# Patient Record
Sex: Female | Born: 1980 | Race: Black or African American | Hispanic: No | Marital: Single | Smoking: Never smoker
Health system: Southern US, Community
[De-identification: ages and names within clinical notes are randomized; demographics above are authoritative.]

---

## 2016-11-04 ENCOUNTER — Encounter (HOSPITAL_COMMUNITY): Payer: Self-pay | Admitting: Emergency Medicine

## 2016-11-04 ENCOUNTER — Emergency Department (HOSPITAL_COMMUNITY): Payer: Medicaid Other

## 2016-11-04 ENCOUNTER — Emergency Department (HOSPITAL_COMMUNITY)
Admission: EM | Admit: 2016-11-04 | Discharge: 2016-11-04 | Disposition: A | Discharge status: Other Acute Inpatient Hospital | Payer: Medicaid Other | Attending: Emergency Medicine | Admitting: Emergency Medicine

## 2016-11-04 DIAGNOSIS — Y999 Unspecified external cause status: Secondary | ICD-10-CM | POA: Diagnosis not present

## 2016-11-04 DIAGNOSIS — S32445A Nondisplaced fracture of posterior column [ilioischial] of left acetabulum, initial encounter for closed fracture: Secondary | ICD-10-CM | POA: Diagnosis not present

## 2016-11-04 DIAGNOSIS — S8991XA Unspecified injury of right lower leg, initial encounter: Secondary | ICD-10-CM | POA: Diagnosis present

## 2016-11-04 DIAGNOSIS — S32402A Unspecified fracture of left acetabulum, initial encounter for closed fracture: Secondary | ICD-10-CM

## 2016-11-04 DIAGNOSIS — F10920 Alcohol use, unspecified with intoxication, uncomplicated: Secondary | ICD-10-CM

## 2016-11-04 DIAGNOSIS — F10229 Alcohol dependence with intoxication, unspecified: Secondary | ICD-10-CM | POA: Diagnosis not present

## 2016-11-04 DIAGNOSIS — Y939 Activity, unspecified: Secondary | ICD-10-CM | POA: Insufficient documentation

## 2016-11-04 DIAGNOSIS — S92141A Displaced dome fracture of right talus, initial encounter for closed fracture: Secondary | ICD-10-CM | POA: Diagnosis not present

## 2016-11-04 DIAGNOSIS — S0990XA Unspecified injury of head, initial encounter: Secondary | ICD-10-CM | POA: Insufficient documentation

## 2016-11-04 DIAGNOSIS — M542 Cervicalgia: Secondary | ICD-10-CM | POA: Diagnosis not present

## 2016-11-04 DIAGNOSIS — S82831A Other fracture of upper and lower end of right fibula, initial encounter for closed fracture: Secondary | ICD-10-CM

## 2016-11-04 DIAGNOSIS — S0081XA Abrasion of other part of head, initial encounter: Secondary | ICD-10-CM | POA: Diagnosis not present

## 2016-11-04 DIAGNOSIS — S82421A Displaced transverse fracture of shaft of right fibula, initial encounter for closed fracture: Secondary | ICD-10-CM | POA: Diagnosis not present

## 2016-11-04 DIAGNOSIS — Y92410 Unspecified street and highway as the place of occurrence of the external cause: Secondary | ICD-10-CM | POA: Insufficient documentation

## 2016-11-04 DIAGNOSIS — S92144A Nondisplaced dome fracture of right talus, initial encounter for closed fracture: Secondary | ICD-10-CM

## 2016-11-04 LAB — CBC WITH DIFFERENTIAL/PLATELET
Basophils Absolute: 0 10*3/uL (ref 0.0–0.1)
Basophils Relative: 0 %
EOS ABS: 0 10*3/uL (ref 0.0–0.7)
EOS PCT: 1 %
HCT: 42.6 % (ref 36.0–46.0)
HEMOGLOBIN: 13.8 g/dL (ref 12.0–15.0)
LYMPHS ABS: 1.7 10*3/uL (ref 0.7–4.0)
Lymphocytes Relative: 35 %
MCH: 30.8 pg (ref 26.0–34.0)
MCHC: 32.4 g/dL (ref 30.0–36.0)
MCV: 95.1 fL (ref 78.0–100.0)
MONOS PCT: 9 %
Monocytes Absolute: 0.4 10*3/uL (ref 0.1–1.0)
Neutro Abs: 2.6 10*3/uL (ref 1.7–7.7)
Neutrophils Relative %: 55 %
Platelets: 162 10*3/uL (ref 150–400)
RBC: 4.48 MIL/uL (ref 3.87–5.11)
RDW: 13 % (ref 11.5–15.5)
WBC: 4.8 10*3/uL (ref 4.0–10.5)

## 2016-11-04 LAB — COMPREHENSIVE METABOLIC PANEL
ALT: 19 U/L (ref 14–54)
AST: 25 U/L (ref 15–41)
Albumin: 3.9 g/dL (ref 3.5–5.0)
Alkaline Phosphatase: 32 U/L — ABNORMAL LOW (ref 38–126)
Anion gap: 11 (ref 5–15)
BILIRUBIN TOTAL: 0.5 mg/dL (ref 0.3–1.2)
BUN: 10 mg/dL (ref 6–20)
CALCIUM: 9 mg/dL (ref 8.9–10.3)
CO2: 22 mmol/L (ref 22–32)
CREATININE: 0.78 mg/dL (ref 0.44–1.00)
Chloride: 109 mmol/L (ref 101–111)
GFR calc Af Amer: >60 mL/min (ref >60)
Glucose, Bld: 86 mg/dL (ref 65–99)
Potassium: 3.5 mmol/L (ref 3.5–5.1)
Sodium: 142 mmol/L (ref 135–145)
TOTAL PROTEIN: 6.8 g/dL (ref 6.5–8.1)

## 2016-11-04 LAB — ETHANOL: ALCOHOL ETHYL (B): 173 mg/dL — AB (ref <5)

## 2016-11-04 LAB — I-STAT CG4 LACTIC ACID, ED
LACTIC ACID, VENOUS: 2.87 mmol/L — AB (ref 0.5–1.9)
Lactic Acid, Venous: 4.16 mmol/L (ref 0.5–1.9)

## 2016-11-04 LAB — SAMPLE TO BLOOD BANK

## 2016-11-04 LAB — I-STAT BETA HCG BLOOD, ED (MC, WL, AP ONLY): I-stat hCG, quantitative: <5 m[IU]/mL (ref <5)

## 2016-11-04 MED ORDER — SODIUM CHLORIDE 0.9 % IV BOLUS (SEPSIS)
1000.0000 mL | Freq: Once | INTRAVENOUS | Status: AC
  Administered 2016-11-04: 1000 mL via INTRAVENOUS

## 2016-11-04 MED ORDER — IOPAMIDOL (ISOVUE-300) INJECTION 61%
INTRAVENOUS | Status: AC
  Administered 2016-11-04: 100 mL
  Filled 2016-11-04: qty 100

## 2016-11-04 MED ORDER — ONDANSETRON HCL 4 MG/2ML IJ SOLN
4.0000 mg | Freq: Once | INTRAMUSCULAR | Status: AC
  Administered 2016-11-04: 4 mg via INTRAVENOUS
  Filled 2016-11-04: qty 2

## 2016-11-04 MED ORDER — HYDROMORPHONE HCL 2 MG/ML IJ SOLN
1.0000 mg | Freq: Once | INTRAMUSCULAR | Status: AC
  Administered 2016-11-04: 1 mg via INTRAVENOUS
  Filled 2016-11-04: qty 1

## 2016-11-04 MED ORDER — FENTANYL CITRATE (PF) 100 MCG/2ML IJ SOLN
50.0000 ug | Freq: Once | INTRAMUSCULAR | Status: AC
  Administered 2016-11-04: 50 ug via INTRAVENOUS
  Filled 2016-11-04: qty 2

## 2016-11-04 MED ORDER — OXYCODONE-ACETAMINOPHEN 5-325 MG PO TABS
1.0000 | ORAL_TABLET | Freq: Once | ORAL | Status: AC
  Administered 2016-11-04: 1 via ORAL
  Filled 2016-11-04: qty 1

## 2016-11-04 NOTE — ED Notes (Signed)
[facility] called for Pt transfer to [facility]

## 2016-11-04 NOTE — ED Notes (Signed)
Pt transported to CT ?
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
## 2016-11-04 NOTE — ED Notes (Signed)
Vital signs stable. 

## 2016-11-04 NOTE — ED Notes (Signed)
Contacted CT - pt is 2nd in line for scan
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
